# Patient Record
Sex: Male | Born: 1955
Health system: Southern US, Community
[De-identification: ages and names within clinical notes are randomized; demographics above are authoritative.]

## PROBLEM LIST (undated history)

## (undated) DIAGNOSIS — I10 Essential (primary) hypertension: Secondary | ICD-10-CM

## (undated) DIAGNOSIS — N200 Calculus of kidney: Secondary | ICD-10-CM

## (undated) DIAGNOSIS — E7800 Pure hypercholesterolemia, unspecified: Secondary | ICD-10-CM

## (undated) HISTORY — PX: APPENDECTOMY: SHX54

---

## 2000-08-08 ENCOUNTER — Emergency Department (HOSPITAL_COMMUNITY): Admission: EM | Admit: 2000-08-08 | Discharge: 2000-08-08 | Payer: Self-pay | Admitting: *Deleted

## 2003-09-14 ENCOUNTER — Encounter: Admission: RE | Admit: 2003-09-14 | Discharge: 2003-09-14 | Payer: Self-pay | Admitting: Infectious Diseases

## 2003-10-28 ENCOUNTER — Encounter: Admission: RE | Admit: 2003-10-28 | Discharge: 2003-10-28 | Payer: Self-pay | Admitting: Infectious Diseases

## 2005-06-09 ENCOUNTER — Ambulatory Visit (HOSPITAL_BASED_OUTPATIENT_CLINIC_OR_DEPARTMENT_OTHER): Admission: RE | Admit: 2005-06-09 | Discharge: 2005-06-09 | Payer: Self-pay | Admitting: Family Medicine

## 2005-06-18 ENCOUNTER — Ambulatory Visit: Payer: Self-pay | Admitting: Internal Medicine

## 2005-11-13 ENCOUNTER — Encounter: Admission: RE | Admit: 2005-11-13 | Discharge: 2005-11-13 | Payer: Self-pay | Admitting: Gastroenterology

## 2007-01-06 ENCOUNTER — Ambulatory Visit: Payer: Self-pay | Admitting: Internal Medicine

## 2007-01-06 ENCOUNTER — Inpatient Hospital Stay (HOSPITAL_COMMUNITY): Admission: EM | Admit: 2007-01-06 | Discharge: 2007-01-07 | Payer: Self-pay | Admitting: Emergency Medicine

## 2009-06-07 IMAGING — CR DG ABDOMEN ACUTE W/ 1V CHEST
4 series · 4 of 4 positions shown · non-contrast
Comparison: CT exam.

CLINICAL DATA: 50-year-old with abdominal pain, diverticulitis. 
 ACUTE ABDOMINAL SERIES - 3 VIEW:

[w chest pa]
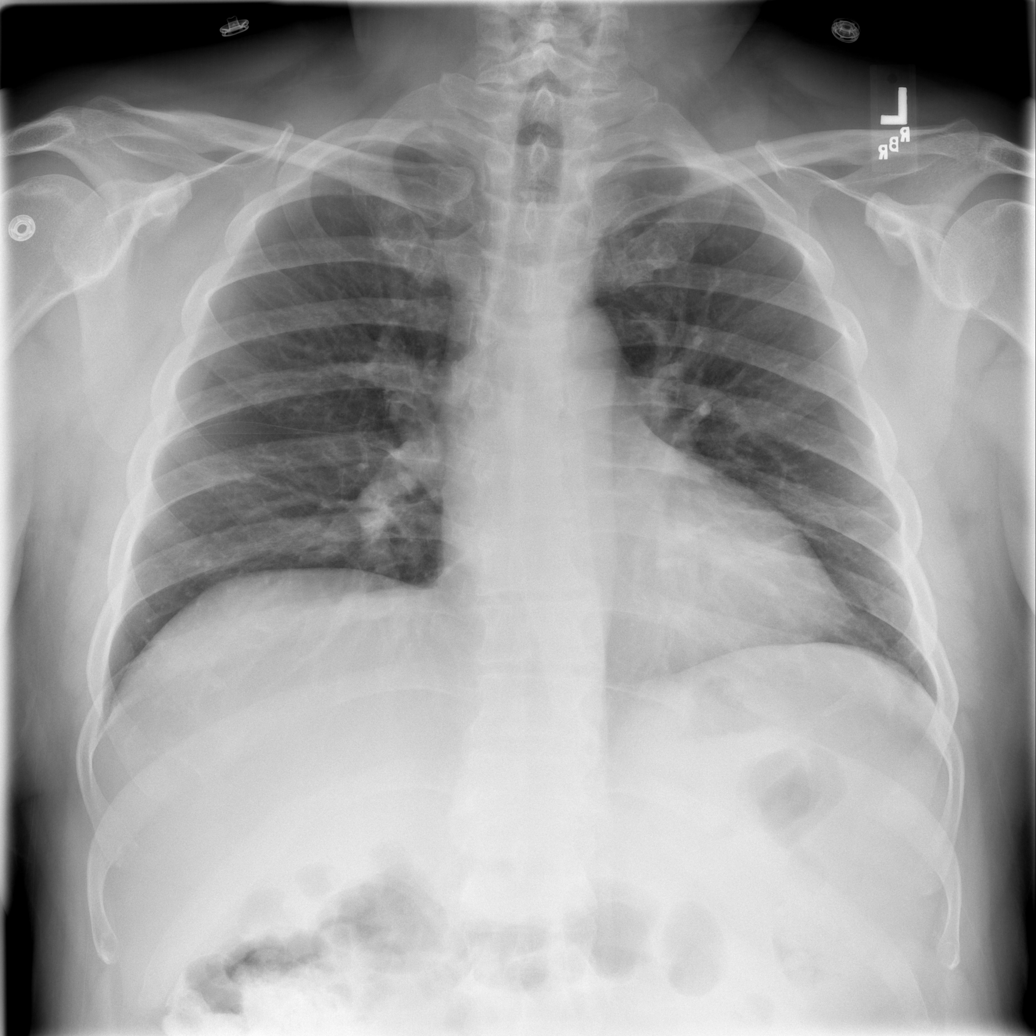

[w abdomen upright]
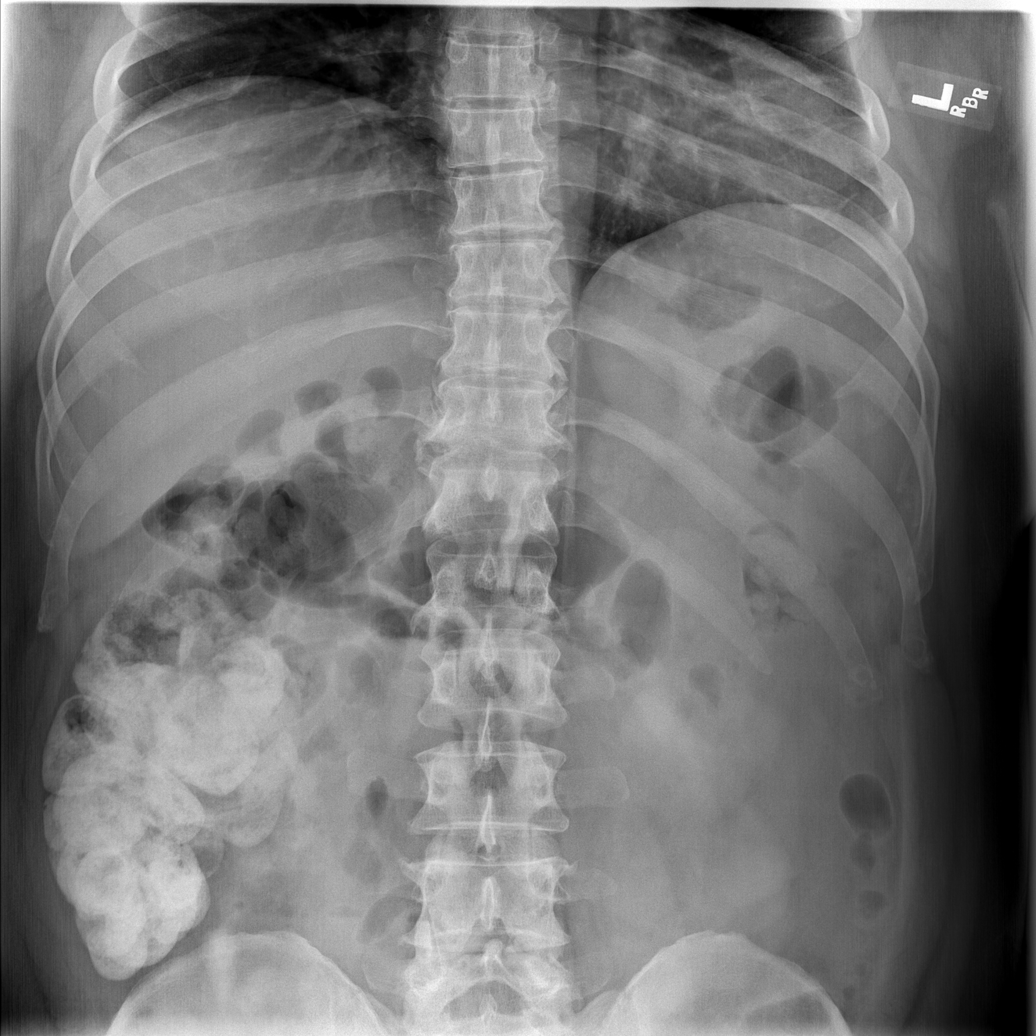

[t abdomen supine (1 of 2)]
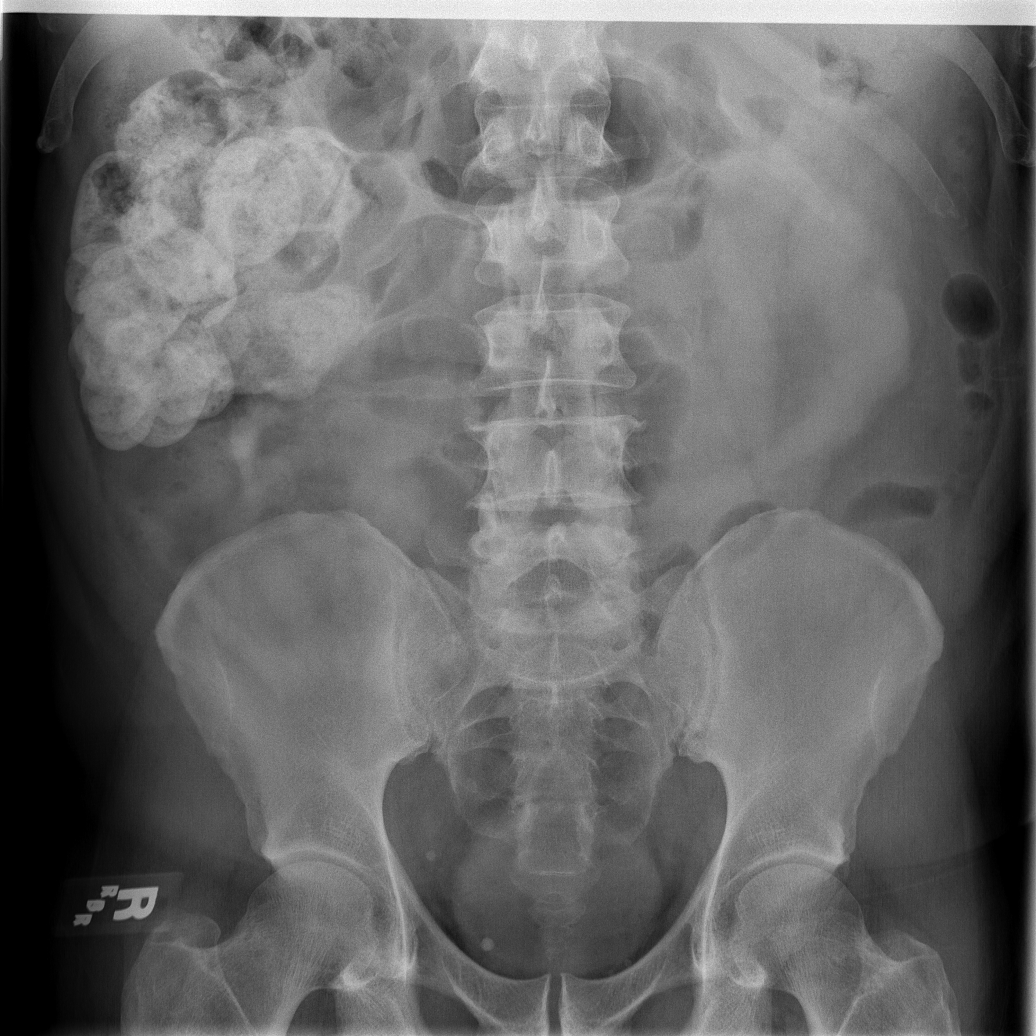

[t abdomen supine (2 of 2)]
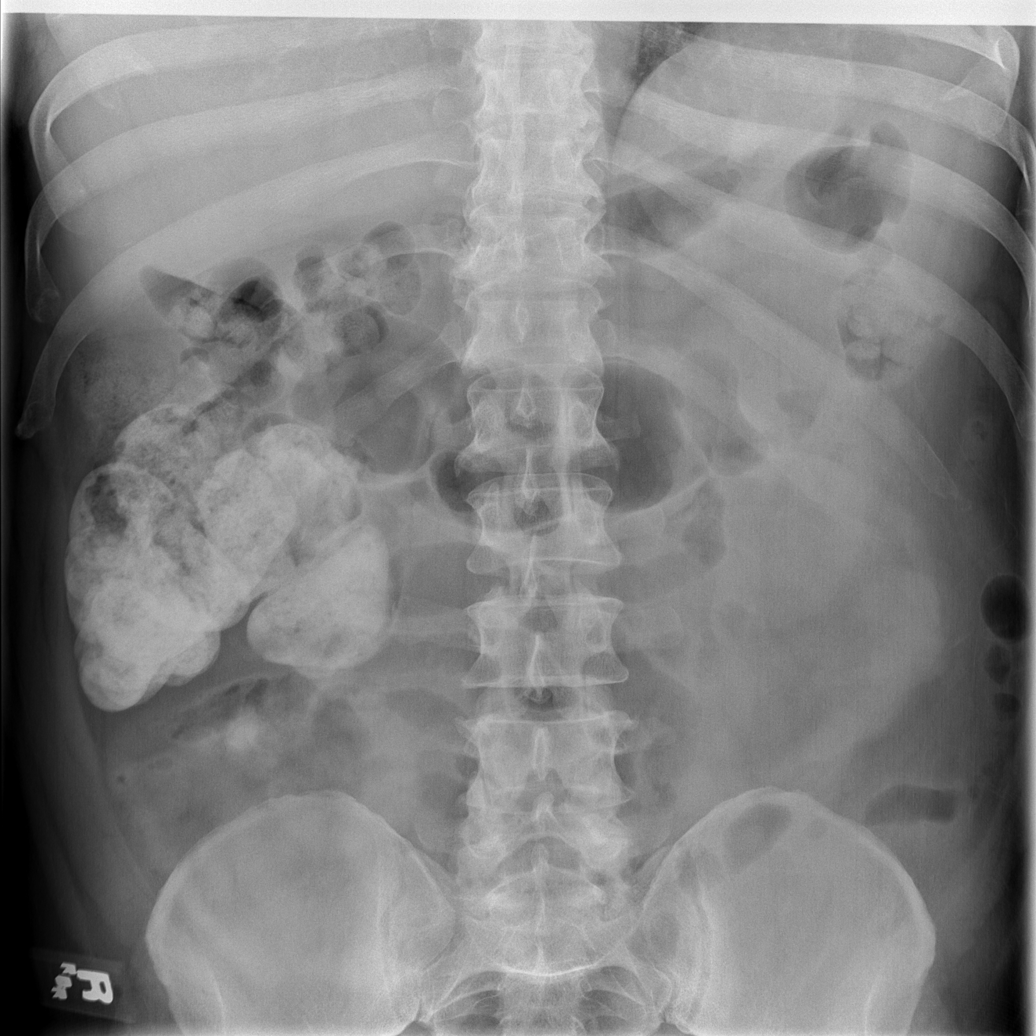

[4 of 4 positions shown; findings below may reference images not displayed]

FINDINGS: Heart is normal in size.  Lungs are free of focal consolidations and pleural effusions.  There is no free intraperitoneal air.  Supine and erect views of the abdomen show residual contrast in the ascending colon.  Bowel loops are not dilated.  Scattered phleboliths are seen within the pelvis.  Degenerative changes are noted in the spine.
IMPRESSION: No evidence for acute cardiopulmonary disease or bowel obstruction.

## 2010-12-16 NOTE — Procedures (Signed)
NAME:  Juan Mathis, Juan Mathis NO.:  [id number]   MEDICAL RECORD NO.:  [id number]          PATIENT TYPE:  OUT   LOCATION:  SLEEP CENTER                 FACILITY:  [facility]   PHYSICIAN:  Clinton D. Maple Hudson, M.D. DATE OF BIRTH:  06-22-1956   DATE OF STUDY:  06/09/2005                              NOCTURNAL POLYSOMNOGRAM   REFERRING PHYSICIAN:  Dr. Noberto Retort.   DATE OF STUDY:  June 09, 2005.   INDICATION FOR STUDY:  Hypersomnia with sleep apnea.   EPWORTH SLEEPINESS SCORE:  11/24.   BMI:  34.   WEIGHT:  220 pounds.   SLEEP ARCHITECTURE:  Total sleep time 393 minutes with sleep efficiency 90%.  Stage I was 2%, stage II 67%, stages III and IV 8%, REM 22% of total sleep  time. Sleep latency 13 minutes, REM latency 87 minutes, awake after sleep  onset 33 minutes, arousal index 22. No bedtime medication reported.   RESPIRATORY DATA:  Split study protocol. Apnea/hypopnea index (AHI, RDI)  16.8 obstructive events per hour indicating moderate obstructive sleep  apnea/hypopnea syndrome before C-PAP. There were 4 obstructive apneas, 1  central apnea and 41 hypopneas before C-PAP. Events were significantly  positional, associated primarily with supine position but also frequent  while on the left side. REM AHI 0.7. C-PAP was titrated to 7 CWP, AHI 0.6  per hour. A medium ComfortGel mask was used with heated humidifier.   OXYGEN DATA:  Moderately loud snoring with oxygen desaturation to a nadir of  80% before C-PAP. After C-PAP control saturation held 92-96% on room air.   CARDIAC DATA:  Normal sinus rhythm.   MOVEMENT/PARASOMNIA:  A total of 195 limb jerks were noted of which 26 were  associated with arousal or awakening for a periodic limb movement with  arousal index of 4 per hour which is mildly increased.   IMPRESSION/RECOMMENDATIONS:  1.  Moderate obstructive sleep apnea/hypopnea syndrome, AHI 16.8 per hour      with moderately loud snoring and oxygen  desaturation to 80%.  2.  Successful C-PAP titration to 7 CWP, AHI 0.6 per hour. A medium      ComfortGel mask was used with heated      humidifier.  3.  Periodic limb movement with arousal, 4 per hour.      Clinton D. Maple Hudson, M.D.  Diplomate, Biomedical engineer of Sleep Medicine  Electronically Signed     CDY/MEDQ  D:  06/18/2005 09:27:38  T:  06/18/2005 22:25:02  Job:  [id number]

## 2011-05-09 ENCOUNTER — Ambulatory Visit (HOSPITAL_COMMUNITY)
Admission: RE | Admit: 2011-05-09 | Discharge: 2011-05-09 | Disposition: A | Discharge status: Home / Self Care | Payer: 59 | Source: Ambulatory Visit | Attending: Gastroenterology | Admitting: Gastroenterology

## 2011-05-09 ENCOUNTER — Other Ambulatory Visit: Payer: Self-pay | Admitting: Gastroenterology

## 2011-05-09 DIAGNOSIS — Z7982 Long term (current) use of aspirin: Secondary | ICD-10-CM | POA: Insufficient documentation

## 2011-05-09 DIAGNOSIS — I10 Essential (primary) hypertension: Secondary | ICD-10-CM | POA: Insufficient documentation

## 2011-05-09 DIAGNOSIS — E7800 Pure hypercholesterolemia, unspecified: Secondary | ICD-10-CM | POA: Insufficient documentation

## 2011-05-09 DIAGNOSIS — E669 Obesity, unspecified: Secondary | ICD-10-CM | POA: Insufficient documentation

## 2011-05-09 DIAGNOSIS — G4733 Obstructive sleep apnea (adult) (pediatric): Secondary | ICD-10-CM | POA: Insufficient documentation

## 2011-05-09 DIAGNOSIS — K6389 Other specified diseases of intestine: Secondary | ICD-10-CM | POA: Insufficient documentation

## 2011-05-09 DIAGNOSIS — D126 Benign neoplasm of colon, unspecified: Secondary | ICD-10-CM | POA: Insufficient documentation

## 2011-05-09 DIAGNOSIS — Z79899 Other long term (current) drug therapy: Secondary | ICD-10-CM | POA: Insufficient documentation

## 2011-05-18 LAB — DIFFERENTIAL
Basophils Relative: 0
Basophils Relative: 0
Eosinophils Absolute: 0.1
Eosinophils Relative: 1
Monocytes Absolute: 0.8 — ABNORMAL HIGH
Monocytes Absolute: 0.9 — ABNORMAL HIGH
Monocytes Relative: 9
Monocytes Relative: 9
Neutro Abs: 6.5

## 2011-05-18 LAB — BASIC METABOLIC PANEL
CO2: 26
CO2: 28
Calcium: 8.9
Chloride: 105
Chloride: 109
Creatinine, Ser: 1.06
GFR calc Af Amer: >60
GFR calc Af Amer: >60
Glucose, Bld: 122 — ABNORMAL HIGH
Glucose, Bld: 99
Potassium: 3.5
Sodium: 140
Sodium: 141

## 2011-05-18 LAB — CBC
HCT: 36.9 — ABNORMAL LOW
Hemoglobin: 12.6 — ABNORMAL LOW
Hemoglobin: 12.9 — ABNORMAL LOW
MCHC: 34.4
MCHC: 35
MCV: 89.1
MCV: 91.3
RBC: 4.03 — ABNORMAL LOW
RBC: 4.14 — ABNORMAL LOW
RDW: 13.2

## 2011-05-18 LAB — URINALYSIS, ROUTINE W REFLEX MICROSCOPIC
Bilirubin Urine: NEGATIVE
Ketones, ur: NEGATIVE
Specific Gravity, Urine: 1.014
Urobilinogen, UA: 1

## 2012-08-12 ENCOUNTER — Emergency Department (HOSPITAL_BASED_OUTPATIENT_CLINIC_OR_DEPARTMENT_OTHER): Payer: Worker's Compensation

## 2012-08-12 ENCOUNTER — Emergency Department (HOSPITAL_BASED_OUTPATIENT_CLINIC_OR_DEPARTMENT_OTHER)
Admission: EM | Admit: 2012-08-12 | Discharge: 2012-08-12 | Disposition: A | Discharge status: Home / Self Care | Payer: Worker's Compensation | Attending: Emergency Medicine | Admitting: Emergency Medicine

## 2012-08-12 ENCOUNTER — Encounter (HOSPITAL_BASED_OUTPATIENT_CLINIC_OR_DEPARTMENT_OTHER): Payer: Self-pay | Admitting: *Deleted

## 2012-08-12 DIAGNOSIS — Y9301 Activity, walking, marching and hiking: Secondary | ICD-10-CM | POA: Insufficient documentation

## 2012-08-12 DIAGNOSIS — I10 Essential (primary) hypertension: Secondary | ICD-10-CM | POA: Insufficient documentation

## 2012-08-12 DIAGNOSIS — E7800 Pure hypercholesterolemia, unspecified: Secondary | ICD-10-CM | POA: Insufficient documentation

## 2012-08-12 DIAGNOSIS — W010XXA Fall on same level from slipping, tripping and stumbling without subsequent striking against object, initial encounter: Secondary | ICD-10-CM | POA: Insufficient documentation

## 2012-08-12 DIAGNOSIS — S83006A Unspecified dislocation of unspecified patella, initial encounter: Secondary | ICD-10-CM | POA: Insufficient documentation

## 2012-08-12 DIAGNOSIS — Y9269 Other specified industrial and construction area as the place of occurrence of the external cause: Secondary | ICD-10-CM | POA: Insufficient documentation

## 2012-08-12 HISTORY — DX: Pure hypercholesterolemia, unspecified: E78.00

## 2012-08-12 HISTORY — DX: Essential (primary) hypertension: I10

## 2012-08-12 MED ORDER — OXYCODONE-ACETAMINOPHEN 5-325 MG PO TABS: 2.0000 | ORAL_TABLET | ORAL | Status: DC | PRN

## 2012-08-12 MED ORDER — IBUPROFEN 600 MG PO TABS: 600.0000 mg | ORAL_TABLET | Freq: Four times a day (QID) | ORAL | Status: AC | PRN

## 2012-08-12 MED ORDER — FENTANYL CITRATE 0.05 MG/ML IJ SOLN
INTRAMUSCULAR | Status: AC
  Administered 2012-08-12: 100 ug
  Filled 2012-08-12: qty 2

## 2012-08-12 NOTE — ED Notes (Signed)
Patient transported to X-ray 

## 2012-08-12 NOTE — ED Provider Notes (Signed)
History     CSN: [id number]  Arrival date & time 08/12/12  1033   First MD Initiated Contact with Patient 08/12/12 1042      Chief Complaint  Patient presents with  . Knee Injury    (Consider location/radiation/quality/duration/timing/severity/associated sxs/prior treatment) HPI Comments: Patient presents with left knee pain after he sustained an injury at work. He states he slipped and his knee jarred in the had sudden pain to his left knee. He denies any numbness or weakness to his foot. He denies any other injury. He denies any past history of dislocations to his knees. He denies any head injury. He denies any loss of consciousness, neck or back pain. He has constant throbbing pain to his left knee since the injury. He was given that now prior to arrival by EMS with some improvement in symptoms.   Past Medical History  Diagnosis Date  . Hypertension   . Hypercholesterolemia     History reviewed. No pertinent past surgical history.  History reviewed. No pertinent family history.  History  Substance Use Topics  . Smoking status: Not on file  . Smokeless tobacco: Not on file  . Alcohol Use:       Review of Systems  Constitutional: Negative for fever, chills, diaphoresis and fatigue.  HENT: Negative for congestion, rhinorrhea and sneezing.   Eyes: Negative.   Respiratory: Negative for cough, chest tightness and shortness of breath.   Cardiovascular: Negative for chest pain.  Gastrointestinal: Negative for nausea, vomiting, abdominal pain, diarrhea and blood in stool.  Genitourinary: Negative for frequency, hematuria, flank pain and difficulty urinating.  Musculoskeletal: Positive for joint swelling. Negative for back pain and arthralgias.  Skin: Negative for rash and wound.  Neurological: Negative for dizziness, speech difficulty, weakness, numbness and headaches.    Allergies  Sulfa antibiotics  Home Medications   Current Outpatient Rx  Name  Route  Sig   Dispense  Refill  . IBUPROFEN 600 MG PO TABS   Oral   Take 1 tablet (600 mg total) by mouth every 6 (six) hours as needed for pain.   30 tablet   0   . OXYCODONE-ACETAMINOPHEN 5-325 MG PO TABS   Oral   Take 2 tablets by mouth every 4 (four) hours as needed for pain.   20 tablet   0     BP 129/77  Pulse 68  Temp 98.3 F (36.8 C) (Oral)  Resp 20  SpO2 94%  Physical Exam  Constitutional: He is oriented to person, place, and time. He appears well-developed and well-nourished.  HENT:  Head: Normocephalic and atraumatic.  Eyes: Pupils are equal, round, and reactive to light.  Neck: Normal range of motion. Neck supple.  Cardiovascular: Normal rate, regular rhythm and normal heart sounds.   Pulmonary/Chest: Effort normal and breath sounds normal. No respiratory distress. He has no wheezes. He has no rales. He exhibits no tenderness.  Abdominal: Soft. Bowel sounds are normal. There is no tenderness. There is no rebound and no guarding.  Musculoskeletal: Normal range of motion. He exhibits no edema.       Patient has a patellar dislocation with the patella dislocated laterally. He has tenderness to the lateral joint area but no tenderness to the medial knee or the posterior aspect of the knee. His pedal pulses are intact. He has normal sensation and motor function in the left foot. He has no pain to the ankle or the hip.  Lymphadenopathy:    He has no cervical adenopathy.  Neurological: He is alert and oriented to person, place, and time.  Skin: Skin is warm and dry. No rash noted.  Psychiatric: He has a normal mood and affect.    ED Course  Reduction of dislocation Date/Time: 08/12/2012 10:59 AM Performed by: Lougenia Morrissey Authorized by: Rolan Bucco Consent: Verbal consent obtained. Risks and benefits: risks, benefits and alternatives were discussed Consent given by: patient Patient identity confirmed: verbally with patient Local anesthesia used: no Patient sedated:  no Patient tolerance: Patient tolerated the procedure well with no immediate complications. Comments: Patient's leg was slowly straightened and the patella reduced without problem. Pulses and sensation were intact in the foot following reduction.   (including critical care time)  No results found for this or any previous visit. Dg Knee Complete 4 Views Left  08/12/2012  *RADIOLOGY REPORT*  Clinical Data: Walking and dislocated knee cap.  Injury.  LEFT KNEE - COMPLETE 4+ VIEW  Comparison: None.  Findings: No joint effusion or fracture.  No significant degenerative changes.  IMPRESSION: No acute findings.   Original Report Authenticated By: Leanna Battles, M.D.      1. Patellar dislocation       MDM  Following the reduction, patient's pain was improved. He has no pain to the posterior aspect of the knee or in the large effusion that would suggest posterior dislocation. His pulses and sensation are intact. He was placed in a knee sleeve and given crutches. He was advised ice and elevation. He was given a referral to follow up with orthopedics.        Rolan Bucco, MD 08/12/12 1122

## 2012-08-12 NOTE — ED Notes (Signed)
At work slipped on some water walking with someone they caught him didn't fall all way to ground heard left knee pop obvious deformity

## 2012-08-12 NOTE — ED Notes (Signed)
MD at bedside. 

## 2015-11-10 DIAGNOSIS — I10 Essential (primary) hypertension: Secondary | ICD-10-CM | POA: Diagnosis not present

## 2015-11-10 DIAGNOSIS — Z0000 Encounter for general adult medical examination without abnormal findings: Secondary | ICD-10-CM | POA: Diagnosis not present

## 2015-11-10 DIAGNOSIS — Z23 Encounter for immunization: Secondary | ICD-10-CM | POA: Diagnosis not present

## 2015-11-10 DIAGNOSIS — E782 Mixed hyperlipidemia: Secondary | ICD-10-CM | POA: Diagnosis not present

## 2015-12-17 DIAGNOSIS — Z86010 Personal history of colonic polyps: Secondary | ICD-10-CM | POA: Diagnosis not present

## 2015-12-17 DIAGNOSIS — K5730 Diverticulosis of large intestine without perforation or abscess without bleeding: Secondary | ICD-10-CM | POA: Diagnosis not present

## 2016-03-08 DIAGNOSIS — R972 Elevated prostate specific antigen [PSA]: Secondary | ICD-10-CM | POA: Diagnosis not present

## 2016-03-20 DIAGNOSIS — N400 Enlarged prostate without lower urinary tract symptoms: Secondary | ICD-10-CM | POA: Diagnosis not present

## 2016-03-20 DIAGNOSIS — R972 Elevated prostate specific antigen [PSA]: Secondary | ICD-10-CM | POA: Diagnosis not present

## 2017-01-19 DIAGNOSIS — E782 Mixed hyperlipidemia: Secondary | ICD-10-CM | POA: Diagnosis not present

## 2017-01-19 DIAGNOSIS — I10 Essential (primary) hypertension: Secondary | ICD-10-CM | POA: Diagnosis not present

## 2017-01-19 DIAGNOSIS — R7303 Prediabetes: Secondary | ICD-10-CM | POA: Diagnosis not present

## 2017-01-19 DIAGNOSIS — E559 Vitamin D deficiency, unspecified: Secondary | ICD-10-CM | POA: Diagnosis not present

## 2017-01-19 DIAGNOSIS — Z0000 Encounter for general adult medical examination without abnormal findings: Secondary | ICD-10-CM | POA: Diagnosis not present

## 2017-02-19 DIAGNOSIS — J069 Acute upper respiratory infection, unspecified: Secondary | ICD-10-CM | POA: Diagnosis not present

## 2017-03-09 DIAGNOSIS — I10 Essential (primary) hypertension: Secondary | ICD-10-CM | POA: Diagnosis not present

## 2017-07-16 DIAGNOSIS — E782 Mixed hyperlipidemia: Secondary | ICD-10-CM | POA: Diagnosis not present

## 2017-07-16 DIAGNOSIS — E559 Vitamin D deficiency, unspecified: Secondary | ICD-10-CM | POA: Diagnosis not present

## 2017-07-16 DIAGNOSIS — R7303 Prediabetes: Secondary | ICD-10-CM | POA: Diagnosis not present

## 2017-07-16 DIAGNOSIS — I10 Essential (primary) hypertension: Secondary | ICD-10-CM | POA: Diagnosis not present

## 2017-08-07 DIAGNOSIS — R972 Elevated prostate specific antigen [PSA]: Secondary | ICD-10-CM | POA: Diagnosis not present

## 2017-08-07 DIAGNOSIS — N401 Enlarged prostate with lower urinary tract symptoms: Secondary | ICD-10-CM | POA: Diagnosis not present

## 2017-08-07 DIAGNOSIS — R351 Nocturia: Secondary | ICD-10-CM | POA: Diagnosis not present

## 2017-10-16 DIAGNOSIS — R1032 Left lower quadrant pain: Secondary | ICD-10-CM | POA: Diagnosis not present

## 2017-10-16 DIAGNOSIS — R319 Hematuria, unspecified: Secondary | ICD-10-CM | POA: Diagnosis not present

## 2017-10-16 DIAGNOSIS — K5792 Diverticulitis of intestine, part unspecified, without perforation or abscess without bleeding: Secondary | ICD-10-CM | POA: Diagnosis not present

## 2018-01-21 DIAGNOSIS — N400 Enlarged prostate without lower urinary tract symptoms: Secondary | ICD-10-CM | POA: Diagnosis not present

## 2018-01-21 DIAGNOSIS — I10 Essential (primary) hypertension: Secondary | ICD-10-CM | POA: Diagnosis not present

## 2018-01-21 DIAGNOSIS — Z136 Encounter for screening for cardiovascular disorders: Secondary | ICD-10-CM | POA: Diagnosis not present

## 2018-01-21 DIAGNOSIS — Z0000 Encounter for general adult medical examination without abnormal findings: Secondary | ICD-10-CM | POA: Diagnosis not present

## 2018-01-21 DIAGNOSIS — E782 Mixed hyperlipidemia: Secondary | ICD-10-CM | POA: Diagnosis not present

## 2018-08-06 DIAGNOSIS — R351 Nocturia: Secondary | ICD-10-CM | POA: Diagnosis not present

## 2018-08-06 DIAGNOSIS — N401 Enlarged prostate with lower urinary tract symptoms: Secondary | ICD-10-CM | POA: Diagnosis not present

## 2018-08-13 DIAGNOSIS — R351 Nocturia: Secondary | ICD-10-CM | POA: Diagnosis not present

## 2018-08-13 DIAGNOSIS — R972 Elevated prostate specific antigen [PSA]: Secondary | ICD-10-CM | POA: Diagnosis not present

## 2018-08-13 DIAGNOSIS — N401 Enlarged prostate with lower urinary tract symptoms: Secondary | ICD-10-CM | POA: Diagnosis not present

## 2018-08-29 DIAGNOSIS — N401 Enlarged prostate with lower urinary tract symptoms: Secondary | ICD-10-CM | POA: Diagnosis not present

## 2018-08-29 DIAGNOSIS — R972 Elevated prostate specific antigen [PSA]: Secondary | ICD-10-CM | POA: Diagnosis not present

## 2018-09-05 DIAGNOSIS — R972 Elevated prostate specific antigen [PSA]: Secondary | ICD-10-CM | POA: Diagnosis not present

## 2018-11-06 DIAGNOSIS — I10 Essential (primary) hypertension: Secondary | ICD-10-CM | POA: Diagnosis not present

## 2018-11-06 DIAGNOSIS — E782 Mixed hyperlipidemia: Secondary | ICD-10-CM | POA: Diagnosis not present

## 2018-11-07 DIAGNOSIS — I10 Essential (primary) hypertension: Secondary | ICD-10-CM | POA: Diagnosis not present

## 2019-03-06 DIAGNOSIS — R351 Nocturia: Secondary | ICD-10-CM | POA: Diagnosis not present

## 2019-03-06 DIAGNOSIS — N401 Enlarged prostate with lower urinary tract symptoms: Secondary | ICD-10-CM | POA: Diagnosis not present

## 2019-05-16 DIAGNOSIS — I10 Essential (primary) hypertension: Secondary | ICD-10-CM | POA: Diagnosis not present

## 2019-05-16 DIAGNOSIS — E782 Mixed hyperlipidemia: Secondary | ICD-10-CM | POA: Diagnosis not present

## 2019-05-16 DIAGNOSIS — Z0000 Encounter for general adult medical examination without abnormal findings: Secondary | ICD-10-CM | POA: Diagnosis not present

## 2019-08-05 DIAGNOSIS — M549 Dorsalgia, unspecified: Secondary | ICD-10-CM | POA: Diagnosis not present

## 2019-08-05 DIAGNOSIS — Z1152 Encounter for screening for COVID-19: Secondary | ICD-10-CM | POA: Diagnosis not present

## 2019-08-05 DIAGNOSIS — R519 Headache, unspecified: Secondary | ICD-10-CM | POA: Diagnosis not present

## 2019-08-05 DIAGNOSIS — J069 Acute upper respiratory infection, unspecified: Secondary | ICD-10-CM | POA: Diagnosis not present

## 2019-08-06 DIAGNOSIS — J069 Acute upper respiratory infection, unspecified: Secondary | ICD-10-CM | POA: Diagnosis not present

## 2020-05-03 ENCOUNTER — Emergency Department (HOSPITAL_BASED_OUTPATIENT_CLINIC_OR_DEPARTMENT_OTHER): Payer: BC Managed Care – PPO

## 2020-05-03 ENCOUNTER — Emergency Department (HOSPITAL_BASED_OUTPATIENT_CLINIC_OR_DEPARTMENT_OTHER)
Admission: EM | Admit: 2020-05-03 | Discharge: 2020-05-03 | Disposition: A | Discharge status: Home / Self Care | Payer: BC Managed Care – PPO | Attending: Emergency Medicine | Admitting: Emergency Medicine

## 2020-05-03 ENCOUNTER — Other Ambulatory Visit: Payer: Self-pay

## 2020-05-03 ENCOUNTER — Encounter (HOSPITAL_BASED_OUTPATIENT_CLINIC_OR_DEPARTMENT_OTHER): Payer: Self-pay | Admitting: *Deleted

## 2020-05-03 DIAGNOSIS — R339 Retention of urine, unspecified: Secondary | ICD-10-CM | POA: Diagnosis not present

## 2020-05-03 DIAGNOSIS — K5900 Constipation, unspecified: Secondary | ICD-10-CM

## 2020-05-03 DIAGNOSIS — E876 Hypokalemia: Secondary | ICD-10-CM | POA: Diagnosis not present

## 2020-05-03 DIAGNOSIS — N200 Calculus of kidney: Secondary | ICD-10-CM | POA: Diagnosis not present

## 2020-05-03 DIAGNOSIS — Z79899 Other long term (current) drug therapy: Secondary | ICD-10-CM | POA: Insufficient documentation

## 2020-05-03 DIAGNOSIS — I10 Essential (primary) hypertension: Secondary | ICD-10-CM | POA: Insufficient documentation

## 2020-05-03 HISTORY — DX: Calculus of kidney: N20.0

## 2020-05-03 LAB — CBC WITH DIFFERENTIAL/PLATELET
Abs Immature Granulocytes: 0.05 10*3/uL (ref 0.00–0.07)
Basophils Absolute: 0 10*3/uL (ref 0.0–0.1)
Basophils Relative: 0 %
Eosinophils Absolute: 0 10*3/uL (ref 0.0–0.5)
Eosinophils Relative: 0 %
HCT: 37.2 % — ABNORMAL LOW (ref 39.0–52.0)
Hemoglobin: 13.1 g/dL (ref 13.0–17.0)
Immature Granulocytes: 0 %
Lymphocytes Relative: 10 %
Lymphs Abs: 1.2 10*3/uL (ref 0.7–4.0)
MCH: 31.3 pg (ref 26.0–34.0)
MCHC: 35.2 g/dL (ref 30.0–36.0)
MCV: 89 fL (ref 80.0–100.0)
Monocytes Absolute: 0.9 10*3/uL (ref 0.1–1.0)
Monocytes Relative: 8 %
Neutro Abs: 9.9 10*3/uL — ABNORMAL HIGH (ref 1.7–7.7)
Neutrophils Relative %: 82 %
Platelets: 179 10*3/uL (ref 150–400)
RBC: 4.18 MIL/uL — ABNORMAL LOW (ref 4.22–5.81)
RDW: 12.5 % (ref 11.5–15.5)
WBC: 12.1 10*3/uL — ABNORMAL HIGH (ref 4.0–10.5)
nRBC: 0 % (ref 0.0–0.2)

## 2020-05-03 LAB — URINALYSIS, ROUTINE W REFLEX MICROSCOPIC
Bilirubin Urine: NEGATIVE
Glucose, UA: NEGATIVE mg/dL
Ketones, ur: NEGATIVE mg/dL
Leukocytes,Ua: NEGATIVE
Nitrite: NEGATIVE
Protein, ur: NEGATIVE mg/dL
Specific Gravity, Urine: 1.015 (ref 1.005–1.030)
pH: 6 (ref 5.0–8.0)

## 2020-05-03 LAB — BASIC METABOLIC PANEL
Anion gap: 13 (ref 5–15)
BUN: 22 mg/dL (ref 8–23)
CO2: 24 mmol/L (ref 22–32)
Calcium: 8.7 mg/dL — ABNORMAL LOW (ref 8.9–10.3)
Chloride: 96 mmol/L — ABNORMAL LOW (ref 98–111)
Creatinine, Ser: 1.07 mg/dL (ref 0.61–1.24)
GFR calc Af Amer: >60 mL/min (ref >60)
GFR calc non Af Amer: >60 mL/min (ref >60)
Glucose, Bld: 127 mg/dL — ABNORMAL HIGH (ref 70–99)
Potassium: 3 mmol/L — ABNORMAL LOW (ref 3.5–5.1)
Sodium: 133 mmol/L — ABNORMAL LOW (ref 135–145)

## 2020-05-03 LAB — URINALYSIS, MICROSCOPIC (REFLEX)

## 2020-05-03 MED ORDER — TAMSULOSIN HCL 0.4 MG PO CAPS: 0.4000 mg | ORAL_CAPSULE | Freq: Every day | ORAL | 0 refills | Status: AC

## 2020-05-03 MED ORDER — POTASSIUM CHLORIDE CRYS ER 20 MEQ PO TBCR
40.0000 meq | EXTENDED_RELEASE_TABLET | Freq: Once | ORAL | Status: AC
  Administered 2020-05-03: 40 meq via ORAL
  Filled 2020-05-03: qty 2

## 2020-05-03 NOTE — ED Triage Notes (Addendum)
Pt c/o constipation that started on Sunday. States he took a suppository yesterday and a fleets prior to arrival. [place] he had "some" results from the constipation. States around 7pm he hasn't been able to urinate. C/o lower abd pain. States he is not sure if the pain is coming from the constipation or if hes having a kidney stone. Remote hx of kidney stones. Pt is not able to lay still due to pain.

## 2020-05-03 NOTE — ED Provider Notes (Signed)
[place] EMERGENCY DEPARTMENT Provider Note   CSN: [id number] Arrival date & time: 05/03/20  0240     History Chief Complaint  Patient presents with  . constipation-urinary retention    Juan Mathis is a 64 y.o. male.  HPI     This is a 64 year old male with a history of hypertension, hypercholesterolemia, and kidney stones who presents with constipation and urinary retention.  Patient reports recent history of constipation.  He states he initially took a suppository with no results.  He subsequently took an enema and states he had "an okay bowel movement."  However, since that time he has noted progressive urinary retention.  Patient reports that he has not urinated since 5 PM yesterday.  He reports lower abdominal and suprapubic pressure and an urge to urinate but inability to urinate.  No known history of urinary retention.  He does have a history of kidney stones but denies any back pain fevers at this time.  He denies any abdominal pain, nausea, vomiting.  Past Medical History:  Diagnosis Date  . Hypercholesterolemia   . Hypertension   . Kidney stones     There are no problems to display for this patient.   Past Surgical History:  Procedure Laterality Date  . APPENDECTOMY         No family history on file.  Social History   Tobacco Use  . Smoking status: Never Smoker  . Smokeless tobacco: Never Used  Substance Use Topics  . Alcohol use: Never  . Drug use: Never    Home Medications Prior to Admission medications   Medication Sig Start Date End Date Taking? Authorizing Provider  hydrochlorothiazide (HYDRODIURIL) 12.5 MG tablet Take 12.5 mg by mouth daily.   Yes [provider]  atorvastatin (LIPITOR) 80 MG tablet Take 80 mg by mouth daily. 03/04/20   [provider]  ibuprofen (ADVIL,MOTRIN) 600 MG tablet Take 1 tablet (600 mg total) by mouth every 6 (six) hours as needed for pain. 08/12/12   Malvin Johns, MD    oxyCODONE-acetaminophen (PERCOCET/ROXICET) 5-325 MG per tablet Take 2 tablets by mouth every 4 (four) hours as needed for pain. 08/12/12   Malvin Johns, MD  tamsulosin (FLOMAX) 0.4 MG CAPS capsule Take 1 capsule (0.4 mg total) by mouth daily. 05/03/20   Chelsie Burel, Barbette Hair, MD    Allergies    Sulfa antibiotics  Review of Systems   Review of Systems  Constitutional: Negative for fever.  Gastrointestinal: Positive for constipation. Negative for abdominal pain, diarrhea, nausea and vomiting.  Genitourinary: Positive for difficulty urinating. Negative for dysuria and flank pain.       Suprapubic discomfort  Musculoskeletal: Negative for back pain.  All other systems reviewed and are negative.   Physical Exam Updated Vital Signs BP 121/68 (BP Location: Right Arm)   Pulse 67   Temp 97.6 F (36.4 C)   Resp (!) 22   Ht 1.676 m (5\' 6" )   Wt 99.3 kg   SpO2 96%   BMI 35.35 kg/m   Physical Exam Vitals and nursing note reviewed.  Constitutional:      Appearance: He is well-developed. He is obese.  HENT:     Head: Normocephalic and atraumatic.     Mouth/Throat:     Mouth: Mucous membranes are moist.  Eyes:     Pupils: Pupils are equal, round, and reactive to light.  Cardiovascular:     Rate and Rhythm: Normal rate and regular rhythm.  Heart sounds: Normal heart sounds. No murmur heard.   Pulmonary:     Effort: Pulmonary effort is normal. No respiratory distress.     Breath sounds: Normal breath sounds. No wheezing.  Abdominal:     General: Bowel sounds are normal.     Palpations: Abdomen is soft.     Tenderness: There is abdominal tenderness. There is no right CVA tenderness, left CVA tenderness or rebound.     Comments: Suprapubic tenderness to palpation, no rebound or guarding palpable mass noted  Musculoskeletal:     Cervical back: Neck supple.     Right lower leg: No edema.     Left lower leg: No edema.  Lymphadenopathy:     Cervical: No cervical adenopathy.   Skin:    General: Skin is warm and dry.  Neurological:     Mental Status: He is alert and oriented to person, place, and time.  Psychiatric:        Mood and Affect: Mood normal.     ED Results / Procedures / Treatments   Labs (all labs ordered are listed, but only abnormal results are displayed) Labs Reviewed  URINALYSIS, ROUTINE W REFLEX MICROSCOPIC - Abnormal; Notable for the following components:      Result Value   Hgb urine dipstick LARGE (*)    All other components within normal limits  CBC WITH DIFFERENTIAL/PLATELET - Abnormal; Notable for the following components:   WBC 12.1 (*)    RBC 4.18 (*)    HCT 37.2 (*)    Neutro Abs 9.9 (*)    All other components within normal limits  BASIC METABOLIC PANEL - Abnormal; Notable for the following components:   Sodium 133 (*)    Potassium 3.0 (*)    Chloride 96 (*)    Glucose, Bld 127 (*)    Calcium 8.7 (*)    All other components within normal limits  URINALYSIS, MICROSCOPIC (REFLEX) - Abnormal; Notable for the following components:   Bacteria, UA FEW (*)    All other components within normal limits    EKG None  Radiology DG Abdomen 1 View  Result Date: 05/03/2020 CLINICAL DATA:  Constipation EXAM: ABDOMEN - 1 VIEW COMPARISON:  None. FINDINGS: There is a large amount of right colonic stool. Air is seen down the level of the distal sigmoid colon. No dilated loops of bowel are noted. No radio-opaque calculi or other significant radiographic abnormality are seen. IMPRESSION: Large amount of right colonic stool. No definite evidence of obstruction. Electronically Signed   By: Prudencio Pair M.D.   On: 05/03/2020 03:36    Procedures Procedures (including critical care time)  Medications Ordered in ED Medications  potassium chloride SA (KLOR-CON) CR tablet 40 mEq (has no administration in time range)    ED Course  I have reviewed the triage vital signs and the nursing notes.  Pertinent labs & imaging results that were  available during my care of the patient were reviewed by me and considered in my medical decision making (see chart for details).    MDM Rules/Calculators/A&P                           Patient presents with acute urinary retention in the setting of recent constipation.  He is overall nontoxic and vital signs are reassuring.  He has some mild suprapubic tenderness with palpable mass.  Likely bladder distention.  Initial bladder scans are negative; however, given patient's history and  physical exam finding, requested catheter placement.  Patient agreeable to plan.  Patient had greater than 2 L of urine upon catheter placement and had complete resolution of his abdominal discomfort.  Urinalysis reviewed and shows large hemoglobin but no signs of infection.  Suspect constipation contributing to his bladder retention although given the volume of retention, suspect he has been retaining without noticing it for some time.  Given volume, basic lab was obtained to evaluate renal function.  He has mild hypokalemia and hypochloremia.  Renal function is preserved.  He was given oral potassium supplementation.  KUB of abdomen does show a colonic stool burden.  Discussed with patient options regarding cleanout.  Recommend laxative and ongoing enema use.  He will maintain Foley catheter and follow-up with urology.  In the meantime he will work on aggressive bowel regimen at home.  He is agreeable to plan.  Will start on Flomax and he will start MiraLAX 2-3 times daily.  After history, exam, and medical workup I feel the patient has been appropriately medically screened and is safe for discharge home. Pertinent diagnoses were discussed with the patient. Patient was given return precautions.   Final Clinical Impression(s) / ED Diagnoses Final diagnoses:  Urinary retention  Constipation, unspecified constipation type  Hypokalemia    Rx / DC Orders ED Discharge Orders         Ordered    tamsulosin (FLOMAX) 0.4 MG  CAPS capsule  Daily        05/03/20 0557           Merryl Hacker, MD 05/03/20 [id number]

## 2020-05-03 NOTE — Discharge Instructions (Addendum)
You were seen today for constipation and urinary retention.  Often times, we see these together.  Keep the catheter in place until follow-up with urology.  Make an appointment with urologist in the next 3 to 5 days.  In the meantime, take MiraLAX 1 capful, 2-3 times per day until stools are loose.  Make sure that you are staying hydrated.  Increase potassium in your diet as this was found to be slightly low.

## 2020-05-05 DIAGNOSIS — R338 Other retention of urine: Secondary | ICD-10-CM | POA: Diagnosis not present

## 2020-05-05 DIAGNOSIS — R972 Elevated prostate specific antigen [PSA]: Secondary | ICD-10-CM | POA: Diagnosis not present

## 2020-05-12 DIAGNOSIS — R338 Other retention of urine: Secondary | ICD-10-CM | POA: Diagnosis not present

## 2020-05-12 DIAGNOSIS — R972 Elevated prostate specific antigen [PSA]: Secondary | ICD-10-CM | POA: Diagnosis not present

## 2020-05-14 DIAGNOSIS — R972 Elevated prostate specific antigen [PSA]: Secondary | ICD-10-CM | POA: Diagnosis not present

## 2020-05-14 DIAGNOSIS — R338 Other retention of urine: Secondary | ICD-10-CM | POA: Diagnosis not present

## 2020-05-20 DIAGNOSIS — E782 Mixed hyperlipidemia: Secondary | ICD-10-CM | POA: Diagnosis not present

## 2020-05-20 DIAGNOSIS — I10 Essential (primary) hypertension: Secondary | ICD-10-CM | POA: Diagnosis not present

## 2020-07-16 DIAGNOSIS — N401 Enlarged prostate with lower urinary tract symptoms: Secondary | ICD-10-CM | POA: Diagnosis not present

## 2020-07-16 DIAGNOSIS — R3914 Feeling of incomplete bladder emptying: Secondary | ICD-10-CM | POA: Diagnosis not present

## 2020-07-16 DIAGNOSIS — R972 Elevated prostate specific antigen [PSA]: Secondary | ICD-10-CM | POA: Diagnosis not present

## 2020-08-09 DIAGNOSIS — R311 Benign essential microscopic hematuria: Secondary | ICD-10-CM | POA: Diagnosis not present

## 2020-08-09 DIAGNOSIS — R3914 Feeling of incomplete bladder emptying: Secondary | ICD-10-CM | POA: Diagnosis not present

## 2020-08-09 DIAGNOSIS — N3000 Acute cystitis without hematuria: Secondary | ICD-10-CM | POA: Diagnosis not present

## 2020-08-23 DIAGNOSIS — I10 Essential (primary) hypertension: Secondary | ICD-10-CM | POA: Diagnosis not present

## 2020-08-23 DIAGNOSIS — R7303 Prediabetes: Secondary | ICD-10-CM | POA: Diagnosis not present

## 2020-08-23 DIAGNOSIS — E782 Mixed hyperlipidemia: Secondary | ICD-10-CM | POA: Diagnosis not present

## 2020-08-23 DIAGNOSIS — E559 Vitamin D deficiency, unspecified: Secondary | ICD-10-CM | POA: Diagnosis not present

## 2020-08-23 DIAGNOSIS — Z0000 Encounter for general adult medical examination without abnormal findings: Secondary | ICD-10-CM | POA: Diagnosis not present

## 2020-08-23 DIAGNOSIS — Z23 Encounter for immunization: Secondary | ICD-10-CM | POA: Diagnosis not present

## 2020-08-23 DIAGNOSIS — N1831 Chronic kidney disease, stage 3a: Secondary | ICD-10-CM | POA: Diagnosis not present

## 2021-01-19 DIAGNOSIS — R3914 Feeling of incomplete bladder emptying: Secondary | ICD-10-CM | POA: Diagnosis not present

## 2021-02-21 DIAGNOSIS — E782 Mixed hyperlipidemia: Secondary | ICD-10-CM | POA: Diagnosis not present

## 2021-02-21 DIAGNOSIS — R7303 Prediabetes: Secondary | ICD-10-CM | POA: Diagnosis not present

## 2021-02-21 DIAGNOSIS — I10 Essential (primary) hypertension: Secondary | ICD-10-CM | POA: Diagnosis not present

## 2021-02-26 DIAGNOSIS — M545 Low back pain, unspecified: Secondary | ICD-10-CM | POA: Diagnosis not present

## 2021-03-04 DIAGNOSIS — Z86010 Personal history of colonic polyps: Secondary | ICD-10-CM | POA: Diagnosis not present

## 2021-03-04 DIAGNOSIS — K5730 Diverticulosis of large intestine without perforation or abscess without bleeding: Secondary | ICD-10-CM | POA: Diagnosis not present

## 2021-05-03 DIAGNOSIS — H2513 Age-related nuclear cataract, bilateral: Secondary | ICD-10-CM | POA: Diagnosis not present

## 2021-07-20 DIAGNOSIS — R3914 Feeling of incomplete bladder emptying: Secondary | ICD-10-CM | POA: Diagnosis not present

## 2021-07-21 DIAGNOSIS — M722 Plantar fascial fibromatosis: Secondary | ICD-10-CM | POA: Diagnosis not present

## 2021-09-07 DIAGNOSIS — E782 Mixed hyperlipidemia: Secondary | ICD-10-CM | POA: Diagnosis not present

## 2021-09-07 DIAGNOSIS — R7303 Prediabetes: Secondary | ICD-10-CM | POA: Diagnosis not present

## 2021-09-07 DIAGNOSIS — I10 Essential (primary) hypertension: Secondary | ICD-10-CM | POA: Diagnosis not present

## 2021-09-07 DIAGNOSIS — Z23 Encounter for immunization: Secondary | ICD-10-CM | POA: Diagnosis not present

## 2021-09-07 DIAGNOSIS — Z0000 Encounter for general adult medical examination without abnormal findings: Secondary | ICD-10-CM | POA: Diagnosis not present

## 2021-09-07 DIAGNOSIS — F5101 Primary insomnia: Secondary | ICD-10-CM | POA: Diagnosis not present

## 2021-09-07 DIAGNOSIS — L409 Psoriasis, unspecified: Secondary | ICD-10-CM | POA: Diagnosis not present

## 2022-01-16 DIAGNOSIS — R3914 Feeling of incomplete bladder emptying: Secondary | ICD-10-CM | POA: Diagnosis not present

## 2022-01-28 DIAGNOSIS — B349 Viral infection, unspecified: Secondary | ICD-10-CM | POA: Diagnosis not present

## 2022-01-28 DIAGNOSIS — R051 Acute cough: Secondary | ICD-10-CM | POA: Diagnosis not present

## 2022-01-29 ENCOUNTER — Encounter (HOSPITAL_BASED_OUTPATIENT_CLINIC_OR_DEPARTMENT_OTHER): Payer: Self-pay | Admitting: Emergency Medicine

## 2022-01-29 ENCOUNTER — Emergency Department (HOSPITAL_BASED_OUTPATIENT_CLINIC_OR_DEPARTMENT_OTHER): Payer: Medicare Other

## 2022-01-29 ENCOUNTER — Emergency Department (HOSPITAL_BASED_OUTPATIENT_CLINIC_OR_DEPARTMENT_OTHER)
Admission: EM | Admit: 2022-01-29 | Discharge: 2022-01-29 | Disposition: A | Discharge status: Home / Self Care | Payer: Medicare Other | Attending: Emergency Medicine | Admitting: Emergency Medicine

## 2022-01-29 ENCOUNTER — Other Ambulatory Visit: Payer: Self-pay

## 2022-01-29 DIAGNOSIS — R0989 Other specified symptoms and signs involving the circulatory and respiratory systems: Secondary | ICD-10-CM | POA: Diagnosis present

## 2022-01-29 DIAGNOSIS — R6883 Chills (without fever): Secondary | ICD-10-CM | POA: Diagnosis not present

## 2022-01-29 DIAGNOSIS — K760 Fatty (change of) liver, not elsewhere classified: Secondary | ICD-10-CM | POA: Diagnosis not present

## 2022-01-29 DIAGNOSIS — U071 COVID-19: Secondary | ICD-10-CM

## 2022-01-29 DIAGNOSIS — K6389 Other specified diseases of intestine: Secondary | ICD-10-CM | POA: Diagnosis not present

## 2022-01-29 DIAGNOSIS — K5900 Constipation, unspecified: Secondary | ICD-10-CM | POA: Diagnosis not present

## 2022-01-29 DIAGNOSIS — R339 Retention of urine, unspecified: Secondary | ICD-10-CM | POA: Diagnosis not present

## 2022-01-29 DIAGNOSIS — K5730 Diverticulosis of large intestine without perforation or abscess without bleeding: Secondary | ICD-10-CM | POA: Diagnosis not present

## 2022-01-29 DIAGNOSIS — N281 Cyst of kidney, acquired: Secondary | ICD-10-CM | POA: Diagnosis not present

## 2022-01-29 LAB — URINALYSIS, MICROSCOPIC (REFLEX)

## 2022-01-29 LAB — CBC WITH DIFFERENTIAL/PLATELET
Abs Immature Granulocytes: 0.01 10*3/uL (ref 0.00–0.07)
Basophils Absolute: 0 10*3/uL (ref 0.0–0.1)
Basophils Relative: 0 %
Eosinophils Absolute: 0 10*3/uL (ref 0.0–0.5)
Eosinophils Relative: 0 %
HCT: 40.7 % (ref 39.0–52.0)
Hemoglobin: 14.1 g/dL (ref 13.0–17.0)
Immature Granulocytes: 0 %
Lymphocytes Relative: 8 %
Lymphs Abs: 0.6 10*3/uL — ABNORMAL LOW (ref 0.7–4.0)
MCH: 31.8 pg (ref 26.0–34.0)
MCHC: 34.6 g/dL (ref 30.0–36.0)
MCV: 91.9 fL (ref 80.0–100.0)
Monocytes Absolute: 0.5 10*3/uL (ref 0.1–1.0)
Monocytes Relative: 6 %
Neutro Abs: 6.6 10*3/uL (ref 1.7–7.7)
Neutrophils Relative %: 86 %
Platelets: 164 10*3/uL (ref 150–400)
RBC: 4.43 MIL/uL (ref 4.22–5.81)
RDW: 12.8 % (ref 11.5–15.5)
WBC: 7.7 10*3/uL (ref 4.0–10.5)
nRBC: 0 % (ref 0.0–0.2)

## 2022-01-29 LAB — URINALYSIS, ROUTINE W REFLEX MICROSCOPIC
Bilirubin Urine: NEGATIVE
Glucose, UA: NEGATIVE mg/dL
Ketones, ur: NEGATIVE mg/dL
Leukocytes,Ua: NEGATIVE
Nitrite: NEGATIVE
Protein, ur: NEGATIVE mg/dL
Specific Gravity, Urine: 1.015 (ref 1.005–1.030)
pH: 6.5 (ref 5.0–8.0)

## 2022-01-29 LAB — BASIC METABOLIC PANEL
Anion gap: 7 (ref 5–15)
BUN: 18 mg/dL (ref 8–23)
CO2: 26 mmol/L (ref 22–32)
Calcium: 8.7 mg/dL — ABNORMAL LOW (ref 8.9–10.3)
Chloride: 102 mmol/L (ref 98–111)
Creatinine, Ser: 1.2 mg/dL (ref 0.61–1.24)
GFR, Estimated: >60 mL/min (ref >60)
Glucose, Bld: 134 mg/dL — ABNORMAL HIGH (ref 70–99)
Potassium: 3.9 mmol/L (ref 3.5–5.1)
Sodium: 135 mmol/L (ref 135–145)

## 2022-01-29 LAB — RESP PANEL BY RT-PCR (FLU A&B, COVID) ARPGX2
Influenza A by PCR: NEGATIVE
Influenza B by PCR: NEGATIVE
SARS Coronavirus 2 by RT PCR: POSITIVE — AB

## 2022-01-29 MED ORDER — NIRMATRELVIR/RITONAVIR (PAXLOVID)TABLET: 3.0000 | ORAL_TABLET | Freq: Two times a day (BID) | ORAL | 0 refills | Status: AC

## 2022-01-29 MED ORDER — LIDOCAINE HCL URETHRAL/MUCOSAL 2 % EX GEL
1.0000 | Freq: Once | CUTANEOUS | Status: AC
  Administered 2022-01-29: 1 via URETHRAL

## 2022-01-29 NOTE — Discharge Instructions (Signed)
Do not take your Lipitor while you are taking the Paxlovid.  Continue taking your Flomax.  Continue taking MiraLAX for your constipation.  Make an appointment to follow-up with your urologist.  Return to the emergency room if you have any worsening symptoms.

## 2022-01-29 NOTE — ED Triage Notes (Signed)
Pt arrives pov, steady gait c/o constipation with rectal pressure x2 days, urinary retention today. Endorses suppository today. Denies fever, endorses "chills"

## 2022-01-29 NOTE — ED Notes (Signed)
Pt and family stated that they did not want to use the leg bag, but since opened already, gave ti to them as well as a urinal for emptying the big bag at home.

## 2022-01-29 NOTE — ED Provider Notes (Signed)
[facility] EMERGENC[facility] DEPARTMENT Provider Note   CSN: [id number] Arrival date & time: 01/29/22  1108     History  Chief Complaint  Patient presents with   Constipation   Urinary Retention    Juan Mathis is a 66 y.o. male.  Patient is a 66 year old male who presents with difficulty urinating.  He has not been able to urinate since about 5 AM this morning.  He has had some difficulty urinating over the last couple days.  He does have a known history of BPH and is followed by urology.  No history of prior urinary retention.  He does report constipation over the last 2 days as well.  He has some pain in his suprapubic area.  No nausea or vomiting.  No fevers.  He was recently at the beach and says over the last 3 to 4 days he has had some achiness and myalgias.  He has a little bit of runny nose congestion and coughing.  He was seen in urgent care yesterday and was diagnosed with a viral syndrome.  No shortness of breath.       Home Medications Prior to Admission medications   Medication Sig Start Date End Date Taking? Authorizing Provider  nirmatrelvir/ritonavir EUA (PAXLOVID) 20 x 150 MG & 10 x '100MG'$  TABS Take 3 tablets by mouth 2 (two) times daily for 5 days. Patient GFR is 60. Take nirmatrelvir (150 mg) two tablets twice daily for 5 days and ritonavir (100 mg) one tablet twice daily for 5 days. 01/29/22 02/03/22 Yes Malvin Johns, MD  atorvastatin (LIPITOR) 80 MG tablet Take 80 mg by mouth daily. 03/04/20   [provider]  hydrochlorothiazide (HYDRODIURIL) 12.5 MG tablet Take 12.5 mg by mouth daily.    [provider]  ibuprofen (ADVIL,MOTRIN) 600 MG tablet Take 1 tablet (600 mg total) by mouth every 6 (six) hours as needed for pain. 08/12/12   Malvin Johns, MD  tamsulosin (FLOMAX) 0.4 MG CAPS capsule Take 1 capsule (0.4 mg total) by mouth daily. 05/03/20   Horton, Barbette Hair, MD      Allergies    Sulfa antibiotics    Review of Systems   Review  of Systems  Constitutional:  Positive for fatigue. Negative for chills, diaphoresis and fever.  HENT:  Positive for congestion and rhinorrhea. Negative for sneezing.   Eyes: Negative.   Respiratory:  Positive for cough. Negative for chest tightness and shortness of breath.   Cardiovascular:  Negative for chest pain and leg swelling.  Gastrointestinal:  Positive for abdominal pain and constipation. Negative for blood in stool, diarrhea, nausea and vomiting.  Genitourinary:  Positive for difficulty urinating. Negative for flank pain, frequency and hematuria.  Musculoskeletal:  Negative for arthralgias and back pain.  Skin:  Negative for rash.  Neurological:  Negative for dizziness, speech difficulty, weakness, numbness and headaches.    Physical Exam Updated Vital Signs BP 117/74   Pulse 71   Temp 98.1 F (36.7 C) (Oral)   Resp 16   Ht '5\' 6"'$  (1.676 m)   Wt 101.6 kg   SpO2 93%   BMI 36.15 kg/m  Physical Exam Constitutional:      Appearance: He is well-developed.  HENT:     Head: Normocephalic and atraumatic.  Eyes:     Pupils: Pupils are equal, round, and reactive to light.  Cardiovascular:     Rate and Rhythm: Normal rate and regular rhythm.     Heart sounds: Normal heart sounds.  Pulmonary:     Effort: Pulmonary effort is normal. No respiratory distress.     Breath sounds: Normal breath sounds. No wheezing or rales.  Chest:     Chest wall: No tenderness.  Abdominal:     General: Bowel sounds are normal.     Palpations: Abdomen is soft.     Tenderness: There is no abdominal tenderness (Tenderness to the suprapubic area). There is no guarding or rebound.  Musculoskeletal:        General: Normal range of motion.     Cervical back: Normal range of motion and neck supple.  Lymphadenopathy:     Cervical: No cervical adenopathy.  Skin:    General: Skin is warm and dry.     Findings: No rash.  Neurological:     Mental Status: He is alert and oriented to person, place, and  time.     ED Results / Procedures / Treatments   Labs (all labs ordered are listed, but only abnormal results are displayed) Labs Reviewed  RESP PANEL BY RT-PCR (FLU A&B, COVID) ARPGX2 - Abnormal; Notable for the following components:      Result Value   SARS Coronavirus 2 by RT PCR POSITIVE (*)    All other components within normal limits  URINALYSIS, ROUTINE W REFLEX MICROSCOPIC - Abnormal; Notable for the following components:   Hgb urine dipstick MODERATE (*)    All other components within normal limits  BASIC METABOLIC PANEL - Abnormal; Notable for the following components:   Glucose, Bld 134 (*)    Calcium 8.7 (*)    All other components within normal limits  CBC WITH DIFFERENTIAL/PLATELET - Abnormal; Notable for the following components:   Lymphs Abs 0.6 (*)    All other components within normal limits  URINALYSIS, MICROSCOPIC (REFLEX) - Abnormal; Notable for the following components:   Bacteria, UA FEW (*)    All other components within normal limits    EKG None  Radiology DG Abd Acute W/Chest  Result Date: 01/29/2022 CLINICAL DATA:  Constipation and rectal pressure for 2 days, urinary retention today, chills EXAM: DG ABDOMEN ACUTE WITH 1 VIEW CHEST COMPARISON:  Abdominal radiograph 05/03/2020, chest radiograph 01/06/2007 FINDINGS: Normal heart size, mediastinal contours, and pulmonary vascularity. Lungs clear. No infiltrate, pleural effusion, or pneumothorax. Increased stool in proximal half of colon. Single air-filled nonspecific loop of dilated small bowel in LEFT mid abdomen. No evidence of obstruction, bowel wall thickening, or free air. No urinary tract calcifications. Small pelvic phleboliths. Osseous structures unremarkable. IMPRESSION: Normal chest radiograph. Increased stool in proximal half of colon. Single nonspecific air-filled dilated small bowel loop in mid abdomen, without definite evidence of obstruction or wall thickening, question ileus. Electronically  Signed   By: Lavonia Dana M.D.   On: 01/29/2022 14:40    Procedures Procedures    Medications Ordered in ED Medications  lidocaine (XYLOCAINE) 2 % jelly 1 Application (1 Application Urethral Given 01/29/22 1336)    ED Course/ Medical Decision Making/ A&P                           Medical Decision Making Amount and/or Complexity of Data Reviewed Labs: ordered. Radiology: ordered.   Patient is a 66 year old male who presents with constipation and difficulty urinating.  On bladder scan, he was found to have a liter of urine in his bladder.  A Foley catheter was placed which resulted in a large amount of urine return.  It  was sent for testing and shows no signs of infection.  His abdominal pain is greatly improved after the catheter placement.  Acute abdominal series showed proximal stool in the colon with a question of an ileus.  This was interpreted by me and confirmed by radiology.  CT scan shows no evidence of obstruction or other acute abnormalities.  His other labs are nonconcerning.  Creatinine is nonconcerning.  His COVID test is positive.  Discussion was made with him about starting Paxlovid.  He is amenable to this.  His creatinine is okay.  I did advise him to stop taking his Lipitor while taking this.  He is otherwise well-appearing without hypoxia or signs of pneumonia.  Chest x-ray which was done on the acute abdominal series shows no evidence of pneumonia.  This was interpreted by me.  He was encouraged to follow-up with his urologist regarding the Foley catheter and when to remove it.  Return precautions were given.   Final Clinical Impression(s) / ED Diagnoses Final diagnoses:  Urinary retention  COVID-19 virus infection  Constipation, unspecified constipation type    Rx / DC Orders ED Discharge Orders          Ordered    nirmatrelvir/ritonavir EUA (PAXLOVID) 20 x 150 MG & 10 x '100MG'$  TABS  2 times daily        01/29/22 1518              Malvin Johns,  MD 01/29/22 1530

## 2022-02-03 DIAGNOSIS — R338 Other retention of urine: Secondary | ICD-10-CM | POA: Diagnosis not present

## 2022-02-11 ENCOUNTER — Encounter (HOSPITAL_BASED_OUTPATIENT_CLINIC_OR_DEPARTMENT_OTHER): Payer: Self-pay | Admitting: Emergency Medicine

## 2022-02-11 ENCOUNTER — Emergency Department (HOSPITAL_BASED_OUTPATIENT_CLINIC_OR_DEPARTMENT_OTHER)
Admission: EM | Admit: 2022-02-11 | Discharge: 2022-02-11 | Disposition: A | Discharge status: Home / Self Care | Payer: Medicare Other | Attending: Emergency Medicine | Admitting: Emergency Medicine

## 2022-02-11 DIAGNOSIS — Z466 Encounter for fitting and adjustment of urinary device: Secondary | ICD-10-CM | POA: Diagnosis not present

## 2022-02-11 NOTE — ED Triage Notes (Signed)
Pt has had urinary catheter in for a week. Pt reports he is leaking around the catheter with only some urine going into the bag. Does not feel like he is having any urinary retention. Clear urine in bag.

## 2022-02-11 NOTE — Discharge Instructions (Signed)
Your catheter was just removed.  Return if you are unable to urinate.  Follow-up with urology as planned.

## 2022-02-11 NOTE — ED Provider Notes (Signed)
  [facility]PART[facility] Provider Note   CSN: [id number] Arrival date & time: 02/11/22  1619     History  Chief Complaint  Patient presents with   Urinary catheter leaking    Juan Mathis is a 66 y.o. male.  HPI Patient presents with difficulty with his Foley catheter.  Has been leaking around it.  Has had the catheter in since urinary retention 13 days ago.  States he was constipated and thinks that was blocking things.  Now drainage around the catheter.  Feels that the catheter is blocked up now.  No fevers or chills.  Is hoping for catheter removal.    Home Medications Prior to Admission medications   Medication Sig Start Date End Date Taking? Authorizing Provider  atorvastatin (LIPITOR) 80 MG tablet Take 80 mg by mouth daily. 03/04/20   [provider]  hydrochlorothiazide (HYDRODIURIL) 12.5 MG tablet Take 12.5 mg by mouth daily.    [provider]  ibuprofen (ADVIL,MOTRIN) 600 MG tablet Take 1 tablet (600 mg total) by mouth every 6 (six) hours as needed for pain. 08/12/12   Malvin Johns, MD  tamsulosin (FLOMAX) 0.4 MG CAPS capsule Take 1 capsule (0.4 mg total) by mouth daily. 05/03/20   Horton, Barbette Hair, MD      Allergies    Sulfa antibiotics    Review of Systems   Review of Systems  Physical Exam Updated Vital Signs BP (!) 168/95   Pulse 99   Temp 98.1 F (36.7 C) (Oral)   Resp 18   SpO2 97%  Physical Exam Vitals and nursing note reviewed.  Abdominal:     Tenderness: There is no abdominal tenderness.  Genitourinary:    Comments: Foley catheter in place. Skin:    Capillary Refill: Capillary refill takes less than 2 seconds.  Neurological:     Mental Status: He is alert.     ED Results / Procedures / Treatments   Labs (all labs ordered are listed, but only abnormal results are displayed) Labs Reviewed - No data to display  EKG None  Radiology No results found.  Procedures Procedures    Medications  Ordered in ED Medications - No data to display  ED Course/ Medical Decision Making/ A&P                           Medical Decision Making  Patient with a potentially obstructed Foley catheter.  Has had in for around 13 days.  Feels as if the constipation from before was the precipitating cause.  Discussed with patient about removal versus flushing versus replacement.  This point we have decided to remove the catheter.  Has urinated some since and will be discharged home with urology follow-up.  If recurs with the obstruction will need new catheter.        Final Clinical Impression(s) / ED Diagnoses Final diagnoses:  Encounter for Foley catheter removal    Rx / DC Orders ED Discharge Orders     None         Davonna Belling, MD 02/11/22 2025

## 2022-02-11 NOTE — ED Notes (Signed)
Pt went to the restroom immediatly after the foley was discontinued.

## 2022-02-17 DIAGNOSIS — R3914 Feeling of incomplete bladder emptying: Secondary | ICD-10-CM | POA: Diagnosis not present

## 2022-02-17 DIAGNOSIS — R8279 Other abnormal findings on microbiological examination of urine: Secondary | ICD-10-CM | POA: Diagnosis not present

## 2022-07-19 DIAGNOSIS — R3914 Feeling of incomplete bladder emptying: Secondary | ICD-10-CM | POA: Diagnosis not present

## 2022-09-11 DIAGNOSIS — I10 Essential (primary) hypertension: Secondary | ICD-10-CM | POA: Diagnosis not present

## 2022-09-11 DIAGNOSIS — F5101 Primary insomnia: Secondary | ICD-10-CM | POA: Diagnosis not present

## 2022-09-11 DIAGNOSIS — R739 Hyperglycemia, unspecified: Secondary | ICD-10-CM | POA: Diagnosis not present

## 2022-09-11 DIAGNOSIS — N1831 Chronic kidney disease, stage 3a: Secondary | ICD-10-CM | POA: Diagnosis not present

## 2022-09-11 DIAGNOSIS — E782 Mixed hyperlipidemia: Secondary | ICD-10-CM | POA: Diagnosis not present

## 2022-09-11 DIAGNOSIS — Z0000 Encounter for general adult medical examination without abnormal findings: Secondary | ICD-10-CM | POA: Diagnosis not present

## 2022-09-11 DIAGNOSIS — Z23 Encounter for immunization: Secondary | ICD-10-CM | POA: Diagnosis not present

## 2023-01-10 DIAGNOSIS — N3000 Acute cystitis without hematuria: Secondary | ICD-10-CM | POA: Diagnosis not present

## 2023-01-10 DIAGNOSIS — R3914 Feeling of incomplete bladder emptying: Secondary | ICD-10-CM | POA: Diagnosis not present

## 2023-02-19 DIAGNOSIS — R310 Gross hematuria: Secondary | ICD-10-CM | POA: Diagnosis not present

## 2023-02-19 DIAGNOSIS — R300 Dysuria: Secondary | ICD-10-CM | POA: Diagnosis not present

## 2023-02-19 DIAGNOSIS — R3914 Feeling of incomplete bladder emptying: Secondary | ICD-10-CM | POA: Diagnosis not present

## 2023-02-19 DIAGNOSIS — R311 Benign essential microscopic hematuria: Secondary | ICD-10-CM | POA: Diagnosis not present

## 2023-03-13 DIAGNOSIS — N1831 Chronic kidney disease, stage 3a: Secondary | ICD-10-CM | POA: Diagnosis not present

## 2023-03-13 DIAGNOSIS — I10 Essential (primary) hypertension: Secondary | ICD-10-CM | POA: Diagnosis not present

## 2023-03-13 DIAGNOSIS — R7303 Prediabetes: Secondary | ICD-10-CM | POA: Diagnosis not present

## 2023-03-13 DIAGNOSIS — E782 Mixed hyperlipidemia: Secondary | ICD-10-CM | POA: Diagnosis not present

## 2023-03-13 DIAGNOSIS — R5383 Other fatigue: Secondary | ICD-10-CM | POA: Diagnosis not present

## 2023-07-26 DIAGNOSIS — R3121 Asymptomatic microscopic hematuria: Secondary | ICD-10-CM | POA: Diagnosis not present

## 2023-08-02 DIAGNOSIS — R310 Gross hematuria: Secondary | ICD-10-CM | POA: Diagnosis not present

## 2023-08-10 DIAGNOSIS — R3129 Other microscopic hematuria: Secondary | ICD-10-CM | POA: Diagnosis not present

## 2023-08-10 DIAGNOSIS — N281 Cyst of kidney, acquired: Secondary | ICD-10-CM | POA: Diagnosis not present

## 2023-08-10 DIAGNOSIS — K760 Fatty (change of) liver, not elsewhere classified: Secondary | ICD-10-CM | POA: Diagnosis not present

## 2023-08-10 DIAGNOSIS — R311 Benign essential microscopic hematuria: Secondary | ICD-10-CM | POA: Diagnosis not present

## 2023-09-25 DIAGNOSIS — Z0000 Encounter for general adult medical examination without abnormal findings: Secondary | ICD-10-CM | POA: Diagnosis not present

## 2023-09-25 DIAGNOSIS — E7800 Pure hypercholesterolemia, unspecified: Secondary | ICD-10-CM | POA: Diagnosis not present

## 2023-09-25 DIAGNOSIS — I10 Essential (primary) hypertension: Secondary | ICD-10-CM | POA: Diagnosis not present

## 2023-09-25 DIAGNOSIS — R7303 Prediabetes: Secondary | ICD-10-CM | POA: Diagnosis not present

## 2023-09-25 DIAGNOSIS — F5101 Primary insomnia: Secondary | ICD-10-CM | POA: Diagnosis not present

## 2023-11-07 DIAGNOSIS — R311 Benign essential microscopic hematuria: Secondary | ICD-10-CM | POA: Diagnosis not present

## 2023-11-07 DIAGNOSIS — R3914 Feeling of incomplete bladder emptying: Secondary | ICD-10-CM | POA: Diagnosis not present

## 2023-11-22 DIAGNOSIS — H2513 Age-related nuclear cataract, bilateral: Secondary | ICD-10-CM | POA: Diagnosis not present

## 2024-03-12 DIAGNOSIS — R7303 Prediabetes: Secondary | ICD-10-CM | POA: Diagnosis not present

## 2024-03-12 DIAGNOSIS — I10 Essential (primary) hypertension: Secondary | ICD-10-CM | POA: Diagnosis not present

## 2024-03-12 DIAGNOSIS — E7800 Pure hypercholesterolemia, unspecified: Secondary | ICD-10-CM | POA: Diagnosis not present

## 2024-03-12 DIAGNOSIS — M19072 Primary osteoarthritis, left ankle and foot: Secondary | ICD-10-CM | POA: Diagnosis not present
